# Patient Record
Sex: Male | Born: 1965 | Race: White | Hispanic: No | Marital: Married | Smoking: Current every day smoker
Health system: Southern US, Community
[De-identification: ages and names within clinical notes are randomized; demographics above are authoritative.]

## PROBLEM LIST (undated history)

## (undated) DIAGNOSIS — J449 Chronic obstructive pulmonary disease, unspecified: Secondary | ICD-10-CM

## (undated) DIAGNOSIS — R011 Cardiac murmur, unspecified: Secondary | ICD-10-CM

## (undated) DIAGNOSIS — F17200 Nicotine dependence, unspecified, uncomplicated: Secondary | ICD-10-CM

## (undated) DIAGNOSIS — I2510 Atherosclerotic heart disease of native coronary artery without angina pectoris: Secondary | ICD-10-CM

## (undated) DIAGNOSIS — E785 Hyperlipidemia, unspecified: Secondary | ICD-10-CM

## (undated) DIAGNOSIS — I639 Cerebral infarction, unspecified: Secondary | ICD-10-CM

## (undated) HISTORY — PX: HERNIA REPAIR: SHX51

## (undated) HISTORY — DX: Nicotine dependence, unspecified, uncomplicated: F17.200

## (undated) HISTORY — DX: Chronic obstructive pulmonary disease, unspecified: J44.9

## (undated) HISTORY — DX: Hyperlipidemia, unspecified: E78.5

## (undated) HISTORY — PX: SHOULDER SURGERY: SHX246

## (undated) HISTORY — DX: Atherosclerotic heart disease of native coronary artery without angina pectoris: I25.10

## (undated) HISTORY — DX: Cerebral infarction, unspecified: I63.9

## (undated) HISTORY — DX: Cardiac murmur, unspecified: R01.1

---

## 2016-02-08 DIAGNOSIS — E782 Mixed hyperlipidemia: Secondary | ICD-10-CM | POA: Insufficient documentation

## 2016-02-08 DIAGNOSIS — F329 Major depressive disorder, single episode, unspecified: Secondary | ICD-10-CM | POA: Insufficient documentation

## 2016-02-08 DIAGNOSIS — I2510 Atherosclerotic heart disease of native coronary artery without angina pectoris: Secondary | ICD-10-CM

## 2016-02-08 HISTORY — DX: Atherosclerotic heart disease of native coronary artery without angina pectoris: I25.10

## 2016-02-10 DIAGNOSIS — J432 Centrilobular emphysema: Secondary | ICD-10-CM | POA: Insufficient documentation

## 2018-04-09 DIAGNOSIS — F1910 Other psychoactive substance abuse, uncomplicated: Secondary | ICD-10-CM | POA: Insufficient documentation

## 2018-05-09 DIAGNOSIS — F17200 Nicotine dependence, unspecified, uncomplicated: Secondary | ICD-10-CM

## 2018-05-09 HISTORY — DX: Nicotine dependence, unspecified, uncomplicated: F17.200

## 2018-05-10 DIAGNOSIS — I639 Cerebral infarction, unspecified: Secondary | ICD-10-CM | POA: Insufficient documentation

## 2018-05-11 DIAGNOSIS — R7303 Prediabetes: Secondary | ICD-10-CM | POA: Diagnosis not present

## 2018-05-11 DIAGNOSIS — R221 Localized swelling, mass and lump, neck: Secondary | ICD-10-CM | POA: Diagnosis not present

## 2018-05-11 DIAGNOSIS — I6350 Cerebral infarction due to unspecified occlusion or stenosis of unspecified cerebral artery: Secondary | ICD-10-CM | POA: Diagnosis not present

## 2018-05-12 DIAGNOSIS — R7303 Prediabetes: Secondary | ICD-10-CM | POA: Diagnosis not present

## 2018-05-12 DIAGNOSIS — R221 Localized swelling, mass and lump, neck: Secondary | ICD-10-CM | POA: Diagnosis not present

## 2018-05-12 DIAGNOSIS — I6350 Cerebral infarction due to unspecified occlusion or stenosis of unspecified cerebral artery: Secondary | ICD-10-CM | POA: Diagnosis not present

## 2018-05-12 DIAGNOSIS — I6789 Other cerebrovascular disease: Secondary | ICD-10-CM | POA: Diagnosis not present

## 2018-05-15 DIAGNOSIS — M7989 Other specified soft tissue disorders: Secondary | ICD-10-CM | POA: Insufficient documentation

## 2018-05-16 DIAGNOSIS — R011 Cardiac murmur, unspecified: Secondary | ICD-10-CM | POA: Insufficient documentation

## 2018-05-16 HISTORY — DX: Cardiac murmur, unspecified: R01.1

## 2018-05-23 DIAGNOSIS — I639 Cerebral infarction, unspecified: Secondary | ICD-10-CM | POA: Insufficient documentation

## 2018-05-24 ENCOUNTER — Ambulatory Visit: Payer: BLUE CROSS/BLUE SHIELD | Admitting: Cardiology

## 2018-05-24 ENCOUNTER — Ambulatory Visit (INDEPENDENT_AMBULATORY_CARE_PROVIDER_SITE_OTHER): Payer: BLUE CROSS/BLUE SHIELD | Admitting: Cardiology

## 2018-05-24 ENCOUNTER — Encounter: Payer: Self-pay | Admitting: Cardiology

## 2018-05-24 VITALS — BP 104/68 | HR 71 | Ht 73.0 in | Wt 166.0 lb

## 2018-05-24 DIAGNOSIS — M799 Soft tissue disorder, unspecified: Secondary | ICD-10-CM | POA: Diagnosis not present

## 2018-05-24 DIAGNOSIS — F17200 Nicotine dependence, unspecified, uncomplicated: Secondary | ICD-10-CM | POA: Diagnosis not present

## 2018-05-24 DIAGNOSIS — M7989 Other specified soft tissue disorders: Secondary | ICD-10-CM

## 2018-05-24 DIAGNOSIS — I6990 Unspecified sequelae of unspecified cerebrovascular disease: Secondary | ICD-10-CM

## 2018-05-24 DIAGNOSIS — E782 Mixed hyperlipidemia: Secondary | ICD-10-CM

## 2018-05-24 DIAGNOSIS — R0683 Snoring: Secondary | ICD-10-CM

## 2018-05-24 NOTE — Patient Instructions (Signed)
Medication Instructions:  Your physician recommends that you continue on your current medications as directed. Please refer to the Current Medication list given to you today.   Labwork: None  Testing/Procedures: You had an EKG today.   Your physician has recommended that you wear an event monitor. Event monitors are medical devices that record the heart's electrical activity. Doctors most often us these monitors to diagnose arrhythmias. Arrhythmias are problems with the speed or rhythm of the heartbeat. The monitor is a small, portable device. You can wear one while you do your normal daily activities. This is usually used to diagnose what is causing palpitations/syncope (passing out). Wear for 30 days.   You have been referred for a sleep study. You will be contacted to schedule this appointment.   Follow-Up: Your physician recommends that you schedule a follow-up appointment in: 6 weeks.   If you need a refill on your cardiac medications before your next appointment, please call your pharmacy.   Thank you for choosing [facility]! Mady Gemmaatherine Marijah Larranaga, RN [phone]

## 2018-05-24 NOTE — Progress Notes (Signed)
Cardiology Consultation:    Date:  05/24/2018   ID:  Victor Clark, DOB Nov 19, 1966, MRN [id number]  PCP:  Gordan Payment., MD  Cardiologist:  Gypsy Balsam, MD   Referring MD: Gordan Payment., MD   Chief Complaint  Patient presents with  . Follow-up  Had a stroke  History of Present Illness:    Victor Clark is a 52 y.o. male who is being seen today for the evaluation of stroke at the request of Gordan Payment., MD.  Few days ago he became abruptly dizzy to the point that he fell down.  He ended up going to his primary care physician suggestion of MRI was made at that time however he did not want to do it later his dizziness became worse and finally he decided to have MRI done.  MRI showed 2 small lacunar stroke which are new.  He was called and he was advised to go to the emergency room.  He was admitted to the hospital.  Quite extensive evaluation has been done which included MRI of the brain CT of the neck and brain.  He was find to have 2 strokes in the cerebellum.  Overall clinically he is doing better denies having any dizziness he can do what he wants to do.  There is no chest pain tightness squeezing pressure burning chest.  Only CT also incidental finding was a mass in the neck he is already seen ENT team and the plans to do surgery.  Apparently he was told that this is most likely cyst. He smokes.  He is been doing this for years.  There is no cardiac history and patient.  He does not exercise on the regular basis but his work required walking a lot and have no difficulty doing it. He described to have some palpitations that typically fast heartbeat lasting for only a few seconds.  He was never diagnosed with atrial fibrillation.  According to his wife he snores and he stopped breathing at night.  Past Medical History:  Diagnosis Date  . Arteriosclerotic heart disease 02/08/2016  . COPD (chronic obstructive pulmonary disease) (HCC)   . Hyperlipidemia   . Murmur, cardiac 05/16/2018   . Stroke (HCC)   . Tobacco dependence 05/09/2018      Current Medications: Current Meds  Medication Sig  . aspirin EC 81 MG tablet Take 1 tablet by mouth daily.  Marland Kitchen atorvastatin (LIPITOR) 40 MG tablet Take 1 tablet by mouth daily.  Marland Kitchen escitalopram (LEXAPRO) 20 MG tablet Take 2 tablets by mouth daily.  Marland Kitchen gabapentin (NEURONTIN) 300 MG capsule TAKE 2 CAPSULES IN THE MORNING AND AT NOON AND 4 CAPSULES AT BEDTIME  . NIASPAN 500 MG CR tablet Take 1 tablet by mouth daily.     Allergies:   Patient has no known allergies.   Social History   Socioeconomic History  . Marital status: Married    Spouse name: Not on file  . Number of children: Not on file  . Years of education: Not on file  . Highest education level: Not on file  Occupational History  . Not on file  Social Needs  . Financial resource strain: Not on file  . Food insecurity:    Worry: Not on file    Inability: Not on file  . Transportation needs:    Medical: Not on file    Non-medical: Not on file  Tobacco Use  . Smoking status: Current Every Day Smoker  . Smokeless tobacco: Never  Used  Substance and Sexual Activity  . Alcohol use: Not Currently  . Drug use: Not Currently  . Sexual activity: Not on file  Lifestyle  . Physical activity:    Days per week: Not on file    Minutes per session: Not on file  . Stress: Not on file  Relationships  . Social connections:    Talks on phone: Not on file    Gets together: Not on file    Attends religious service: Not on file    Active member of club or organization: Not on file    Attends meetings of clubs or organizations: Not on file    Relationship status: Not on file  Other Topics Concern  . Not on file  Social History Narrative  . Not on file     Family History: The patient's family history is not on file. ROS:   Please see the history of present illness.    All 14 point review of systems negative except as described per history of present  illness.  EKGs/Labs/Other Studies Reviewed:    The following studies were reviewed today: CT of the chest as well as MRI reviewed.  On MRI there was some suspicion about dissection of the vertebral artery however it was not confirmed with CT.  Echocardiogram review showed preserved left ventricular ejection fraction mild left enlarged left atrium  EKG:  EKG is  ordered today.  The ekg ordered today demonstrates normal sinus rhythm normal P interval Q waves V1 V2 a nonspecific ST-T segment changes  Recent Labs: No results found for requested labs within last 8760 hours.  Recent Lipid Panel No results found for: CHOL, TRIG, HDL, CHOLHDL, VLDL, LDLCALC, LDLDIRECT  Physical Exam:    VS:  BP 104/68 (BP Location: Left Arm)   Pulse 71   Ht 6\' 1"  (1.854 m)   Wt 166 lb (75.3 kg)   SpO2 96%   BMI 21.90 kg/m     Wt Readings from Last 3 Encounters:  05/24/18 166 lb (75.3 kg)     GEN:  Well nourished, well developed in no acute distress HEENT: Normal NECK: No JVD; No carotid bruits LYMPHATICS: No lymphadenopathy CARDIAC: RRR, no murmurs, no rubs, no gallops RESPIRATORY:  Clear to auscultation without rales, wheezing or rhonchi  ABDOMEN: Soft, non-tender, non-distended MUSCULOSKELETAL:  No edema; No deformity  SKIN: Warm and dry NEUROLOGIC:  Alert and oriented x 3 PSYCHIATRIC:  Normal affect   ASSESSMENT:    1. Mixed hyperlipidemia   2. Late effects of CVA (cerebrovascular accident)   3. Soft tissue mass   4. Tobacco dependence    PLAN:    In order of problems listed above:  1. Recent CVA involved cerebellum.  He is on aspirin and statin he was advised to quit smoking.  Obviously will look for arrhythmia as potential source of his problem.  I will put 30 days monitor on him.  I told him his monitor will be unrevealing then will consider an implantable loop recorder.  We also talked about potentially doing transesophageal echocardiogram.  However will get monitor first.  He is  already on aspirin statin and he will quit smoking. 2. Soft tissue mass in the neck.  I will talk to Dr. Marcheta Grammes trying to figure out how quickly we need to do surgery.  Patient was told that this is most likely cyst which is encouraging if that is the case we probably can wait obviously if this is a cancer then  we need to proceed rather quickly 3. Tobacco dependence we spent at least 5 minutes talking about this I gave him some hints of liquid he is determined and hopefully he will be able to do that. 4. Snoring: He will be scheduled to have a sleep study.  I see him back in my office in about 6 weeks or sooner if he get a problem   Medication Adjustments/Labs and Tests Ordered: Current medicines are reviewed at length with the patient today.  Concerns regarding medicines are outlined above.  No orders of the defined types were placed in this encounter.  No orders of the defined types were placed in this encounter.   Signed, Georgeanna Leaobert J. Jveon Pound, MD, [facility]. 05/24/2018 9:06 AM     [facility]

## 2018-05-27 ENCOUNTER — Ambulatory Visit: Payer: BLUE CROSS/BLUE SHIELD

## 2018-05-27 DIAGNOSIS — R002 Palpitations: Secondary | ICD-10-CM

## 2018-06-07 NOTE — Addendum Note (Signed)
Addended by: Crist FatLOCKHART, CATHERINE P on: 06/07/2018 04:09 PM   Modules accepted: Orders

## 2018-06-13 ENCOUNTER — Telehealth: Payer: Self-pay | Admitting: *Deleted

## 2018-06-13 NOTE — Telephone Encounter (Signed)
[facility] [employer] no PA is required for sleep study. Ok to schedule.

## 2018-06-13 NOTE — Telephone Encounter (Signed)
-----   Message from Crist Fatatherine P Lockhart, RN sent at 06/07/2018  4:07 PM EDT ----- Regarding: Please precert Please precert this patient for a sleep study due to snoring in evaluation of sleep apnea. Thanks!

## 2018-06-24 ENCOUNTER — Telehealth: Payer: Self-pay | Admitting: *Deleted

## 2018-06-24 NOTE — Telephone Encounter (Signed)
Left message on patient's cell phone per DPR informing him that sleep study has been scheduled for 07/18/18 at [facility]. Address provided. Advised him to call if he had any questions or concerns.

## 2018-07-08 ENCOUNTER — Ambulatory Visit: Payer: BLUE CROSS/BLUE SHIELD | Admitting: Cardiology

## 2018-07-18 ENCOUNTER — Encounter (HOSPITAL_BASED_OUTPATIENT_CLINIC_OR_DEPARTMENT_OTHER): Payer: BLUE CROSS/BLUE SHIELD

## 2018-08-02 ENCOUNTER — Other Ambulatory Visit: Payer: Self-pay | Admitting: Specialist

## 2018-08-02 DIAGNOSIS — I63119 Cerebral infarction due to embolism of unspecified vertebral artery: Secondary | ICD-10-CM

## 2018-08-02 DIAGNOSIS — G4489 Other headache syndrome: Secondary | ICD-10-CM

## 2018-08-02 DIAGNOSIS — I639 Cerebral infarction, unspecified: Secondary | ICD-10-CM

## 2018-08-10 ENCOUNTER — Ambulatory Visit
Admission: RE | Admit: 2018-08-10 | Discharge: 2018-08-10 | Disposition: A | Discharge status: Home / Self Care | Payer: BLUE CROSS/BLUE SHIELD | Source: Ambulatory Visit | Attending: Specialist | Admitting: Specialist

## 2018-08-10 DIAGNOSIS — G4489 Other headache syndrome: Secondary | ICD-10-CM

## 2018-08-10 DIAGNOSIS — I639 Cerebral infarction, unspecified: Secondary | ICD-10-CM

## 2018-08-10 DIAGNOSIS — I63119 Cerebral infarction due to embolism of unspecified vertebral artery: Secondary | ICD-10-CM

## 2018-08-10 MED ORDER — GADOBENATE DIMEGLUMINE 529 MG/ML IV SOLN
15.0000 mL | Freq: Once | INTRAVENOUS | Status: AC | PRN
  Administered 2018-08-10: 15 mL via INTRAVENOUS

## 2020-01-21 IMAGING — MR MR MRA NECK WO/W CM
4 series · 26 of 48 positions shown · IV contrast (multihance)
Comparison: None.

CLINICAL DATA: CVA due to embolism vertebral artery. Cerebellar
infarct. Other headache syndrome.

EXAM:
MRA NECK WITHOUT AND WITH CONTRAST
MRA HEAD WITHOUT CONTRAST
TECHNIQUE: Multiplanar and multiecho pulse sequences of the neck were obtained
without and with intravenous contrast. Angiographic images of the
neck were obtained using MRA technique without and with intravenous
contast.; Angiographic images of the Circle of Willis were obtained
using MRA technique without intravenous contrast.
CONTRAST:  15mL MULTIHANCE GADOBENATE DIMEGLUMINE 529 MG/ML IV SOLN

[Series 3: tof_3d_multi-slab · axial · 0.7mm · 0.35mm/px · z∈[-38,+49]mm · 9 of 143 slices shown]
[im 8/143]
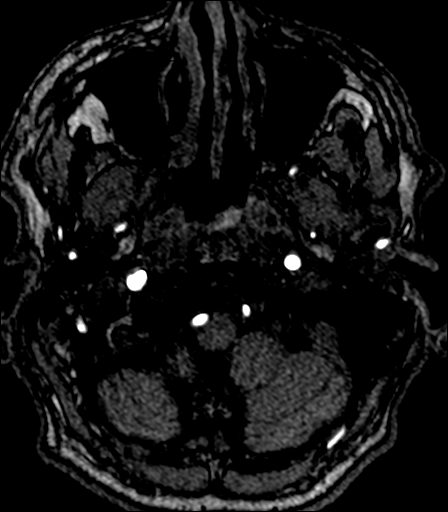
[im 23/143]
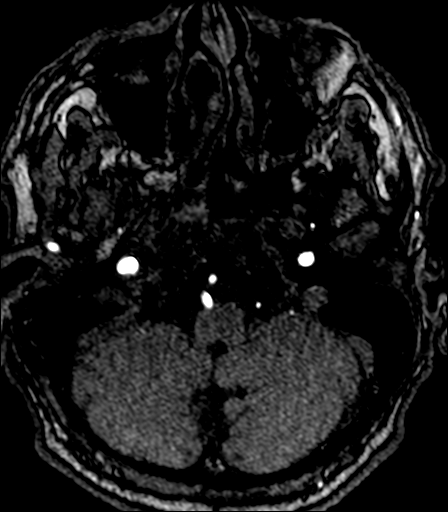
[im 45/143]
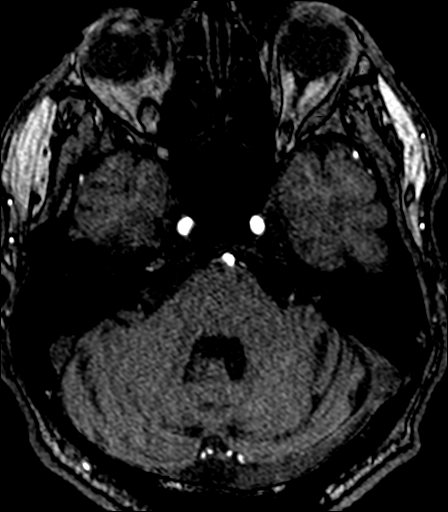
[im 60/143]
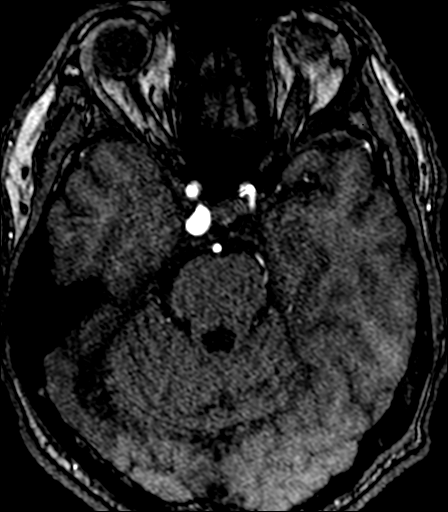
[im 75/143]
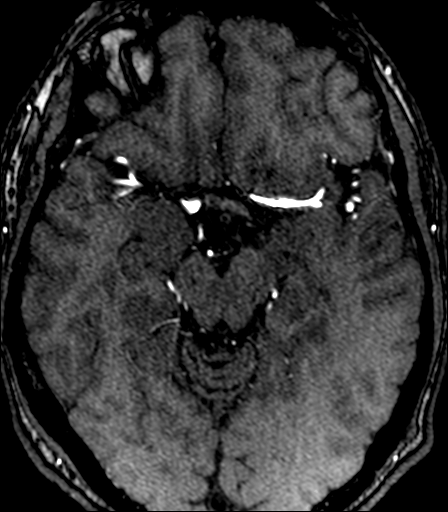
[im 83/143]
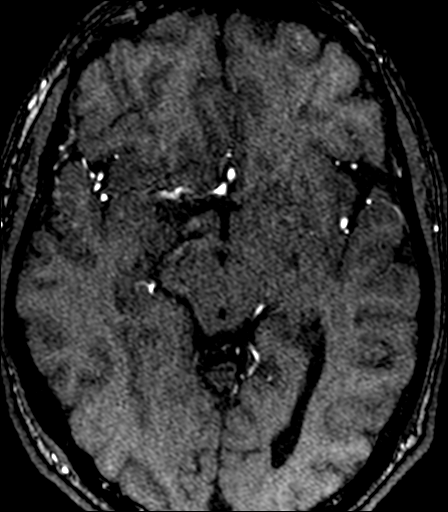
[im 98/143]
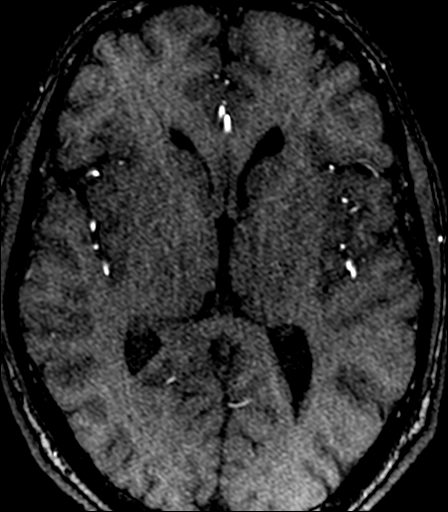
[im 120/143]
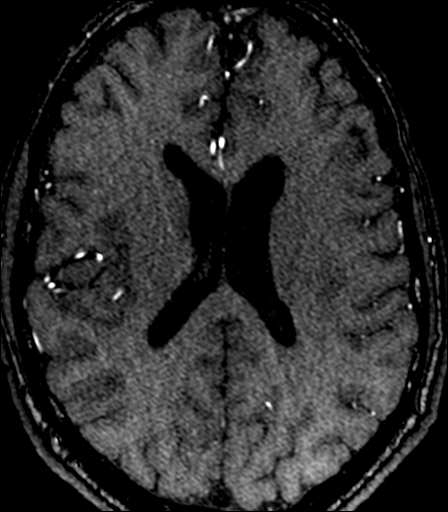
[im 135/143]
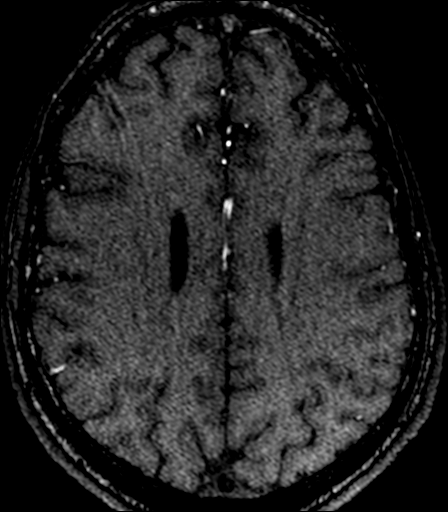

[Series 10: fl_tof_2d · axial · 3.0mm · 0.39mm/px · z∈[-177,-78]mm · 7 of 50 slices shown]
[im 1/50]
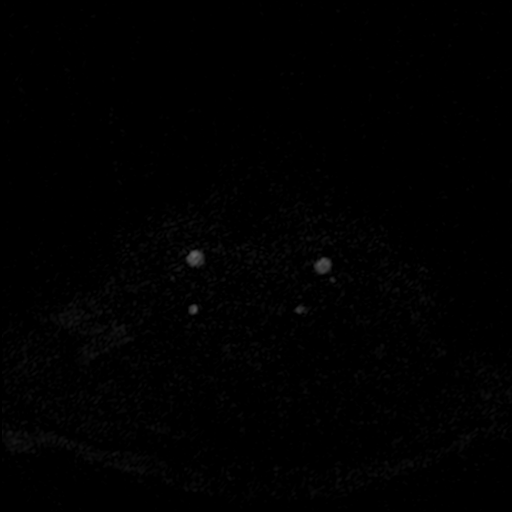
[im 9/50]
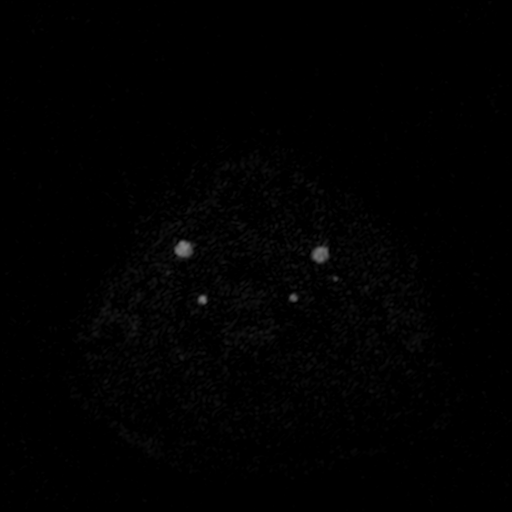
[im 17/50]
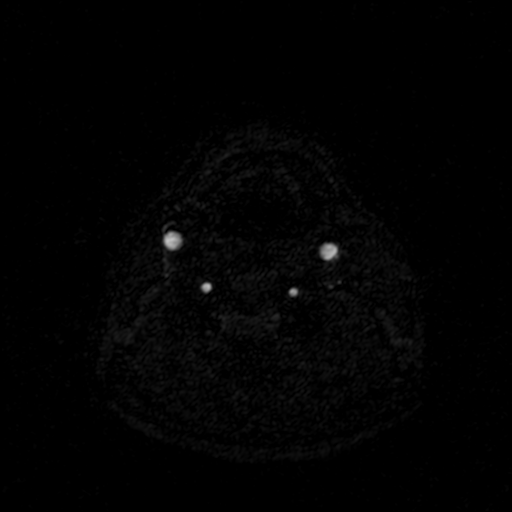
[im 25/50]
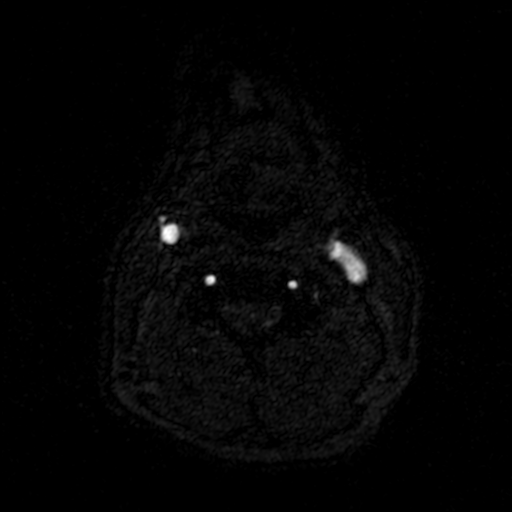
[im 33/50]
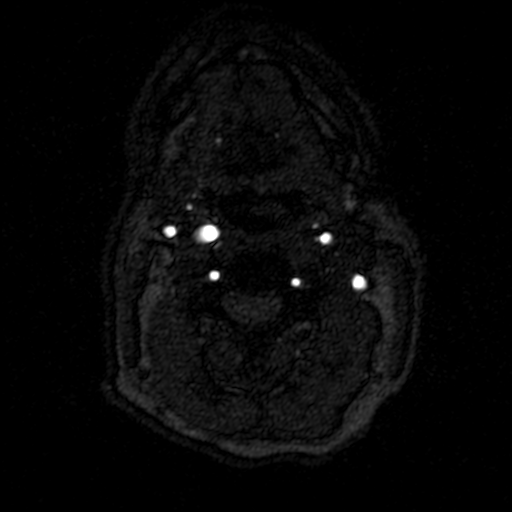
[im 41/50]
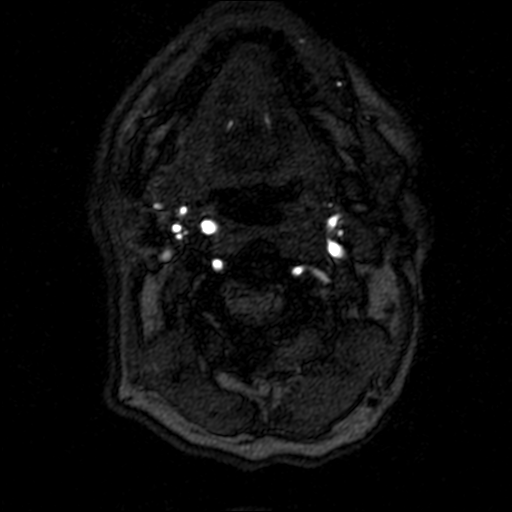
[im 50/50]
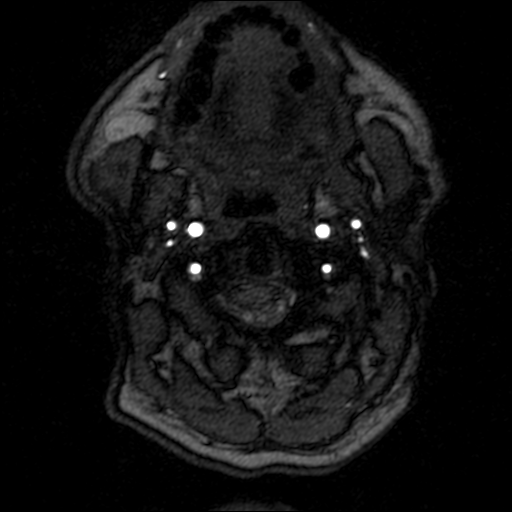

[Series 15: (id)_tt=1.0s · coronal · 0.8mm · 0.78mm/px · 7 of 80 slices shown (1 of 2)]
[im 1/80]
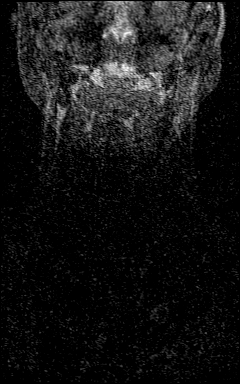
[im 8/80]
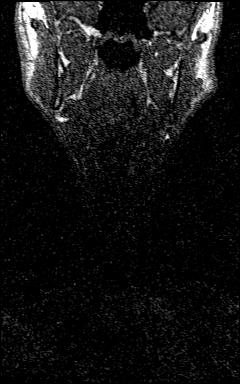
[im 16/80]
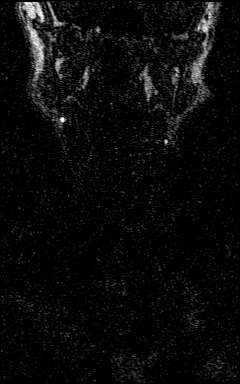
[im 24/80]
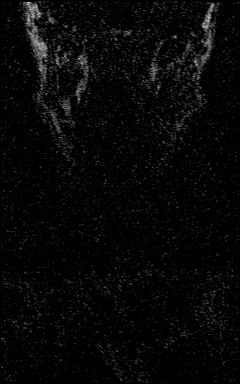
[im 32/80]
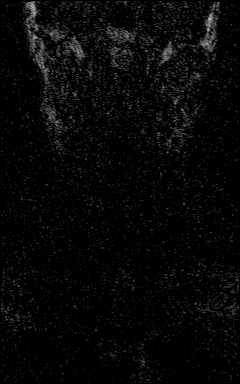
[im 40/80]
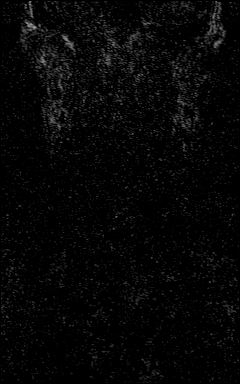
[im 72/80]
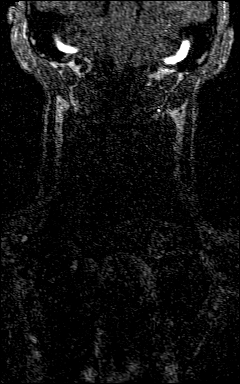

[Series 17: (id)_tt=1.0s · coronal · 0.8mm · 0.78mm/px · 3 of 72 slices shown (2 of 2)]
[im 8/72]
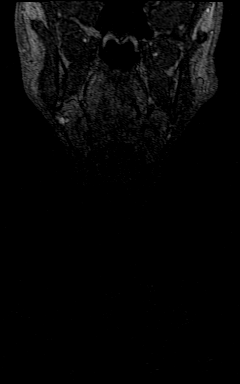
[im 40/72]
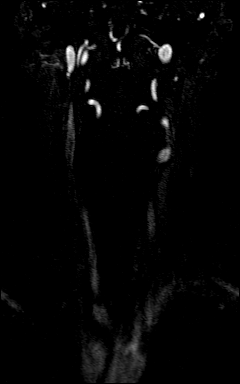
[im 64/72]
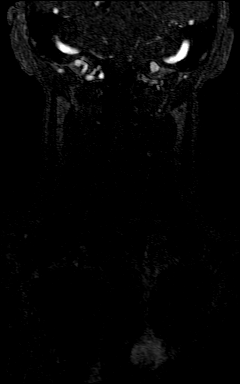

[26 of 48 positions shown; findings below may reference images not displayed]

FINDINGS: MRA NECK FINDINGS

Time-of-flight images demonstrate no significant flow disturbance at
either carotid bifurcation. Flow is antegrade within the vertebral
arteries.

Postcontrast images demonstrate a 3 vessel arch configuration right
common carotid artery is within normal limits. Bifurcation is
unremarkable. Cervical right ICA is normal.

The left common carotid artery is within limits. Bifurcation is
unremarkable. Cervical left ICA is normal.

Both vertebral arteries originate from the subclavian arteries. The
right vertebral artery is the dominant vessel. There is no
significant stenosis or vascular injury in neck.

MRA HEAD FINDINGS

Time-of-flight images demonstrate normal flow in the internal
carotid arteries from the high cervical segments through the ICA
termini bilaterally. The A1 and M1 segments are normal. The anterior
communicating artery is patent. MCA bifurcations are normal. The ACA
and MCA branch vessels are within normal limits.

The right vertebral artery is slightly dominant. There is
fenestration of the vertebrobasilar junction. PICA origins are
visualized and normal. There is focal narrowing of the distal right
vertebral artery which may be congenital given the more proximal
anastomosis. Basilar artery is otherwise normal. The left posterior
cerebral artery is fed equally from a P1 segment and the posterior
communicating artery. The right posterior cerebral artery is of
fetal type with a small P1 segment.
IMPRESSION: 1. Negative MRA of the neck.  Acute or focal vascular abnormality.
2. Normal variant MRA circle-of-Willis without significant proximal
stenosis, aneurysm, or branch vessel occlusion.
3. Typical fenestration of the vertebrobasilar junction as described
above. This is unlikely to be of consequence to the patient.

## 2024-09-30 ENCOUNTER — Encounter: Payer: Self-pay | Admitting: Diagnostic Neuroimaging

## 2024-09-30 ENCOUNTER — Ambulatory Visit: Admitting: Diagnostic Neuroimaging

## 2024-09-30 VITALS — BP 98/67 | HR 63 | Ht 73.0 in | Wt 150.2 lb

## 2024-09-30 DIAGNOSIS — I6930 Unspecified sequelae of cerebral infarction: Secondary | ICD-10-CM | POA: Diagnosis not present

## 2024-09-30 DIAGNOSIS — I639 Cerebral infarction, unspecified: Secondary | ICD-10-CM

## 2024-09-30 DIAGNOSIS — F17200 Nicotine dependence, unspecified, uncomplicated: Secondary | ICD-10-CM

## 2024-09-30 DIAGNOSIS — E782 Mixed hyperlipidemia: Secondary | ICD-10-CM

## 2024-09-30 NOTE — Progress Notes (Signed)
 [facility]  PATIENT: Victor Clark DOB: November 27, 1966  REFERRING CLINICIAN: Jackolyn Darice BROCKS, FNP HISTORY FROM: patient  REASON FOR VISIT: new consult   HISTORICAL  CHIEF COMPLAINT:  Chief Complaint  Patient presents with   RM 7     Patient is here with wife  for Hospital follow-up for CVA - has been having issues with spelling and when typing makes incomplete sentences and memory issues and anxiety has gotten worse causing pain in hands     HISTORY OF PRESENT ILLNESS:   58 year old male here for evaluation of hospital stroke follow-up.  History of hyperlipidemia, tobacco abuse, atherosclerotic heart disease, myocardial infarction, COPD, stroke.  2020 patient had 2 strokes associated with vertigo and speech difficulty.  In May 2025 had onset of vertigo, nausea and vomiting.  Went to the hospital for evaluation.  He was found to have an acute right cerebellar ischemic infarction.  Stroke workup was completed.  He was discharged on aspirin and Plavix for 21 days and then aspirin alone.  Also started on atorvastatin.  He is still smoking cigarettes.  A1c was elevated and he is drinking 2 L of Pepsi per day.  Having more issues with anxiety, panic attack, depression, insomnia.  Also noting some difficulty with memory focus and attention.  The symptoms are new since May 2025.   REVIEW OF SYSTEMS: Full 14 system review of systems performed and negative with exception of: as per HPI.  ALLERGIES: No Known Allergies  HOME MEDICATIONS: Outpatient Medications Prior to Visit  Medication Sig Dispense Refill   ALPRAZolam (XANAX) 1 MG tablet Take 1 mg by mouth 3 (three) times daily as needed.     aspirin EC 81 MG tablet Take 1 tablet by mouth daily.     atorvastatin (LIPITOR) 40 MG tablet Take 1 tablet by mouth daily.     buPROPion (WELLBUTRIN XL) 150 MG 24 hr tablet Take 150 mg by mouth.     escitalopram (LEXAPRO) 20 MG tablet Take 2 tablets by mouth daily.      gabapentin (NEURONTIN) 300 MG capsule TAKE 2 CAPSULES IN THE MORNING AND AT NOON AND 4 CAPSULES AT BEDTIME     NIASPAN 500 MG CR tablet Take 1 tablet by mouth daily.     No facility-administered medications prior to visit.    PAST MEDICAL HISTORY: Past Medical History:  Diagnosis Date   Arteriosclerotic heart disease 02/08/2016   COPD (chronic obstructive pulmonary disease) (HCC)    Hyperlipidemia    Murmur, cardiac 05/16/2018   Stroke [facility])    Tobacco dependence 05/09/2018    PAST SURGICAL HISTORY: Past Surgical History:  Procedure Laterality Date   HERNIA REPAIR     SHOULDER SURGERY      FAMILY HISTORY: History reviewed. No pertinent family history.  SOCIAL HISTORY: Social History   Socioeconomic History   Marital status: Married    Spouse name: Not on file   Number of children: Not on file   Years of education: Not on file   Highest education level: Not on file  Occupational History   Not on file  Tobacco Use   Smoking status: Every Day    Passive exposure: Current   Smokeless tobacco: Never  Substance and Sexual Activity   Alcohol use: Not Currently   Drug use: Not Currently   Sexual activity: Not on file  Other Topics Concern   Not on file  Social History Narrative   1- 2 bottles of soda daily / some  coffee 1 cup    Social Drivers of Corporate investment banker Strain: Not on file  Food Insecurity: Low Risk  (11/19/2023)   Received from [facility]   Hunger Vital Sign    Within the past 12 months, you worried that your food would run out before you got money to buy more: Never true    Within the past 12 months, the food you bought just didn't last and you didn't have money to get more. : Never true  Transportation Needs: No Transportation Needs (11/19/2023)   Received from [facility]    In the past 12 months, has lack of reliable transportation kept you from medical appointments, meetings, work or from getting things needed for daily  living? : No  Physical Activity: Not on file  Stress: Not on file  Social Connections: Not on file  Intimate Partner Violence: Not on file     PHYSICAL EXAM  GENERAL EXAM/CONSTITUTIONAL: Vitals:  Vitals:   09/30/24 1328  BP: 98/67  Pulse: 63  SpO2: 96%  Weight: 150 lb 3.2 oz (68.1 kg)  Height: 6' 1 (1.854 m)   Body mass index is 19.82 kg/m. Wt Readings from Last 3 Encounters:  09/30/24 150 lb 3.2 oz (68.1 kg)  05/24/18 166 lb (75.3 kg)   Patient is in no distress; well developed, nourished and groomed; neck is supple  CARDIOVASCULAR: Examination of carotid arteries is normal; no carotid bruits Regular rate and rhythm, no murmurs Examination of peripheral vascular system by observation and palpation is normal  EYES: Ophthalmoscopic exam of optic discs and posterior segments is normal; no papilledema or hemorrhages No results found.  MUSCULOSKELETAL: Gait, strength, tone, movements noted in Neurologic exam below  NEUROLOGIC: MENTAL STATUS:      No data to display         awake, alert, oriented to person, place and time recent and remote memory intact normal attention and concentration language fluent, comprehension intact, naming intact fund of knowledge appropriate  CRANIAL NERVE:  2nd - no papilledema on fundoscopic exam 2nd, 3rd, 4th, 6th - pupils equal and reactive to light, visual fields full to confrontation, extraocular muscles intact, no nystagmus 5th - facial sensation symmetric 7th - facial strength symmetric 8th - hearing intact 9th - palate elevates symmetrically, uvula midline 11th - shoulder shrug symmetric 12th - tongue protrusion midline  MOTOR:  normal bulk and tone, full strength in the BUE, BLE  SENSORY:  normal and symmetric to light touch, temperature, vibration  COORDINATION:  finger-nose-finger, fine finger movements normal  REFLEXES:  deep tendon reflexes TRACE and symmetric  GAIT/STATION:  narrow based  gait     DIAGNOSTIC DATA (LABS, IMAGING, TESTING) - I reviewed patient records, labs, notes, testing and imaging myself where available.  No results found for: WBC, HGB, HCT, MCV, PLT No results found for: NA, K, CL, CO2, GLUCOSE, BUN, CREATININE, CALCIUM, PROT, ALBUMIN, AST, ALT, ALKPHOS, BILITOT, GFRNONAA, GFRAA No results found for: CHOL, HDL, LDLCALC, LDLDIRECT, TRIG, CHOLHDL No results found for: YHAJ8R No results found for: VITAMINB12 No results found for: TSH   CT head - 1.5 x 1.2 x 1.8 cm of encephalomalacia in the left occipital lobe with chronic features, new compared to prior study.  1.2 cm focus of low-density in the right occipital deep white matter consistent with encephalomalacia similar to prior.  CTA of the head and neck - No schema stenosis or occlusion is identified in the neck or cerebral circulation  MRI brain - Small focus of acute ischemia in the right cerebellar hemisphere.  Mild chronic small vessel ischemic disease.   ASSESSMENT AND PLAN  58 y.o. year old male here with:   Dx:  1. Cerebellar infarction (HCC)   2. Mixed hyperlipidemia   3. Tobacco dependence   4. Late effects of CVA (cerebrovascular accident)      PLAN:  RIGHT CEREBELLAR ISCHEMIC INFARCTION - cryptogenic stroke; recommend implanted loop record via cardiology - continue aspirin 81mg  - continue atorvastatin 40mg  (may increase up to 80mg  daily) - smoking cessation reviewed - start B12 replacement (1000mcg oral daily) - monitor sugars (A1c 6.5; follow up with PCP for treatment)  ANXIETY / DEPRESSION / MEMORY (since May 2025; likely related to adjustment and stress from his stroke event) - follow up with PCP; consider psychiatry / psychology  Return for return to PCP, pending if symptoms worsen or fail to improve.  I spent 60 minutes of face-to-face and non-face-to-face time with patient.  This included pre and post  visit chart review, lab review, study review, order entry, electronic health record documentation, patient education.     EDUARD FABIENE HANLON, MD 09/30/2024, 4:54 PM Certified in Neurology, Neurophysiology and Neuroimaging  [facility] [address] [place], [place] [address] [phone]

## 2024-09-30 NOTE — Patient Instructions (Signed)
  RIGHT CEREBELLAR ISCHEMIC INFARCTION - cryptogenic stroke; recommend implanted loop record via cardiology - continue aspirin 81mg  - continue atorvastatin 40mg  (may increase up to 80mg  daily) - smoking cessation reviewed - start B12 replacement (1000mcg oral daily) - monitor sugars (A1c 6.5; follow up with PCP for treatment)  ANXIETY / DEPRESSION / MEMORY  - follow up with PCP; consider psychiatry / psychology
